# Patient Record
Sex: Female | Born: 1980 | Race: White | Hispanic: No | Smoking: Current every day smoker
Health system: Southern US, Community
[De-identification: ages and names within clinical notes are randomized; demographics above are authoritative.]

## PROBLEM LIST (undated history)

## (undated) DIAGNOSIS — E079 Disorder of thyroid, unspecified: Secondary | ICD-10-CM

## (undated) HISTORY — PX: GALLBLADDER SURGERY: SHX652

## (undated) HISTORY — DX: Disorder of thyroid, unspecified: E07.9

---

## 2002-07-29 ENCOUNTER — Emergency Department (HOSPITAL_COMMUNITY): Admission: EM | Admit: 2002-07-29 | Discharge: 2002-07-29 | Payer: Self-pay

## 2004-08-07 ENCOUNTER — Emergency Department (HOSPITAL_COMMUNITY): Admission: EM | Admit: 2004-08-07 | Discharge: 2004-08-07 | Payer: Self-pay | Admitting: Family Medicine

## 2005-06-12 ENCOUNTER — Ambulatory Visit (HOSPITAL_COMMUNITY): Admission: RE | Admit: 2005-06-12 | Discharge: 2005-06-12 | Payer: Self-pay | Admitting: Family Medicine

## 2007-04-25 ENCOUNTER — Ambulatory Visit (HOSPITAL_COMMUNITY): Admission: RE | Admit: 2007-04-25 | Discharge: 2007-04-25 | Payer: Self-pay | Admitting: Family Medicine

## 2008-04-28 ENCOUNTER — Encounter (INDEPENDENT_AMBULATORY_CARE_PROVIDER_SITE_OTHER): Payer: Self-pay | Admitting: Family Medicine

## 2008-04-28 ENCOUNTER — Other Ambulatory Visit: Admission: RE | Admit: 2008-04-28 | Discharge: 2008-04-28 | Payer: Self-pay | Admitting: Family Medicine

## 2008-04-28 ENCOUNTER — Ambulatory Visit (HOSPITAL_COMMUNITY): Admission: RE | Admit: 2008-04-28 | Discharge: 2008-04-28 | Payer: Self-pay | Admitting: Internal Medicine

## 2009-04-29 ENCOUNTER — Ambulatory Visit (HOSPITAL_COMMUNITY): Admission: RE | Admit: 2009-04-29 | Discharge: 2009-04-29 | Payer: Self-pay | Admitting: Family Medicine

## 2011-10-25 ENCOUNTER — Ambulatory Visit (HOSPITAL_COMMUNITY)
Admission: RE | Admit: 2011-10-25 | Discharge: 2011-10-25 | Disposition: A | Discharge status: Home / Self Care | Payer: Managed Care, Other (non HMO) | Source: Ambulatory Visit | Attending: Internal Medicine | Admitting: Internal Medicine

## 2011-10-25 ENCOUNTER — Other Ambulatory Visit (HOSPITAL_COMMUNITY): Payer: Self-pay | Admitting: Internal Medicine

## 2011-10-25 DIAGNOSIS — M25579 Pain in unspecified ankle and joints of unspecified foot: Secondary | ICD-10-CM | POA: Insufficient documentation

## 2011-10-25 DIAGNOSIS — S93409A Sprain of unspecified ligament of unspecified ankle, initial encounter: Secondary | ICD-10-CM

## 2011-10-25 DIAGNOSIS — X58XXXA Exposure to other specified factors, initial encounter: Secondary | ICD-10-CM | POA: Insufficient documentation

## 2011-10-25 DIAGNOSIS — M7730 Calcaneal spur, unspecified foot: Secondary | ICD-10-CM | POA: Insufficient documentation

## 2014-01-05 ENCOUNTER — Encounter: Payer: Self-pay | Admitting: *Deleted

## 2014-01-07 ENCOUNTER — Ambulatory Visit (INDEPENDENT_AMBULATORY_CARE_PROVIDER_SITE_OTHER): Payer: Managed Care, Other (non HMO) | Admitting: Advanced Practice Midwife

## 2014-01-07 ENCOUNTER — Encounter: Payer: Self-pay | Admitting: Advanced Practice Midwife

## 2014-01-07 VITALS — BP 130/90 | Ht 69.0 in | Wt 199.0 lb

## 2014-01-07 DIAGNOSIS — Z30014 Encounter for initial prescription of intrauterine contraceptive device: Secondary | ICD-10-CM

## 2014-01-07 DIAGNOSIS — Z3009 Encounter for other general counseling and advice on contraception: Secondary | ICD-10-CM

## 2014-01-07 MED ORDER — MISOPROSTOL 200 MCG PO TABS: 200.0000 ug | ORAL_TABLET | Freq: Once | ORAL | Status: AC

## 2014-01-07 NOTE — Progress Notes (Signed)
[facility] Visit  Patient name: Tina Huynh MRN [id number]  Date of birth: 07/21/1980  CC & HPI:  Tina Huynh is a 33 y.o. Caucasian female presenting today for discussion of IUD, referred from [facility]  Pertinent History Reviewed:  Medical & Surgical Hx:   Past Medical History  Diagnosis Date  . Thyroid disease    Past Surgical History  Procedure Laterality Date  . Gallbladder surgery     Medications: Reviewed & Updated - see associated section Social History: Reviewed -  reports that she has been smoking Cigarettes.  She has been smoking about 1.00 pack per day. She has never used smokeless tobacco.  Objective Findings:  Vitals: BP 130/90  Ht 5\' 9"  (1.753 m)  Wt 199 lb (90.266 kg)  BMI 29.37 kg/m2  LMP 12/30/2013  Physical Examination: General appearance - alert, well appearing, and in no distress Mental status - alert, oriented to person, place, and time Extremities - no pedal edema noted Skin - normal coloration and turgor, no rashes, no suspicious skin lesions noted  No results found for this or any previous visit (from the past 24 hour(s)).   Discussed SE/Risks/Benefits of Mirena   Assessment & Plan:  A:   IUD consult P:  Premedicate with cytotec   F/U 2weeks, when on menses for Mirnea     CRESENZO-DISHMAN,Neizan Debruhl CNM 01/07/2014 11:23 AM

## 2014-01-22 ENCOUNTER — Encounter: Payer: Self-pay | Admitting: Advanced Practice Midwife

## 2014-01-22 ENCOUNTER — Ambulatory Visit (INDEPENDENT_AMBULATORY_CARE_PROVIDER_SITE_OTHER): Payer: Managed Care, Other (non HMO) | Admitting: Advanced Practice Midwife

## 2014-01-22 VITALS — BP 138/80 | Ht 69.0 in | Wt 201.0 lb

## 2014-01-22 DIAGNOSIS — Z30430 Encounter for insertion of intrauterine contraceptive device: Secondary | ICD-10-CM

## 2014-01-22 DIAGNOSIS — Z3202 Encounter for pregnancy test, result negative: Secondary | ICD-10-CM

## 2014-01-22 LAB — POCT URINE PREGNANCY: Preg Test, Ur: NEGATIVE

## 2014-01-22 NOTE — Progress Notes (Signed)
Laretha Bethann HumbleM Kysar is a 33 y.o. year old Caucasian female Gravida 1 Para 1  who presents for placement of a Mirena IUD. Her LMP was 01/21/14 and her pregnancy test today is negative.  The risks and benefits of the method and placement have been thouroughly reviewed with the patient and all questions were answered.  Specifically the patient is aware of failure rate of 06/998, expulsion of the IUD and of possible perforation.  The patient is aware of irregular bleeding due to the method and understands the incidence of irregular bleeding diminishes with time.  Time out was performed.  A Graves speculum was placed.  The cervix was prepped using Betadine. The uterus was found to be retroflexed and it sounded to 7 cm.  The cervix was grasped with a tenaculum and the IUD was inserted to 7 cm.  It was pulled back 1 cm and the IUD was disengaged.  The strings were trimmed to 3 cm.  Sonogram was performed and the proper placement of the IUD was verified.  The patient was instructed on signs and symptoms of infection and to check for the strings after each menses or each month.  The patient is to refrain from intercourse for 3 days.  The patient is scheduled for a return appointment after her first menses or 4 weeks.  CRESENZO-DISHMAN,Eutimio Gharibian 01/22/2014 2:03 PM

## 2014-02-19 ENCOUNTER — Ambulatory Visit: Payer: Managed Care, Other (non HMO) | Admitting: Advanced Practice Midwife

## 2014-02-26 ENCOUNTER — Ambulatory Visit: Payer: Managed Care, Other (non HMO) | Admitting: Advanced Practice Midwife

## 2014-04-27 ENCOUNTER — Encounter: Payer: Self-pay | Admitting: Advanced Practice Midwife

## 2015-03-22 ENCOUNTER — Other Ambulatory Visit (HOSPITAL_COMMUNITY): Payer: Self-pay | Admitting: Family Medicine

## 2015-03-22 ENCOUNTER — Ambulatory Visit (HOSPITAL_COMMUNITY)
Admission: RE | Admit: 2015-03-22 | Discharge: 2015-03-22 | Disposition: A | Discharge status: Home / Self Care | Payer: Managed Care, Other (non HMO) | Source: Ambulatory Visit | Attending: Family Medicine | Admitting: Family Medicine

## 2015-03-22 DIAGNOSIS — M25572 Pain in left ankle and joints of left foot: Secondary | ICD-10-CM | POA: Insufficient documentation

## 2015-03-22 DIAGNOSIS — S99912A Unspecified injury of left ankle, initial encounter: Secondary | ICD-10-CM

## 2015-03-22 DIAGNOSIS — R937 Abnormal findings on diagnostic imaging of other parts of musculoskeletal system: Secondary | ICD-10-CM | POA: Diagnosis not present

## 2016-02-05 ENCOUNTER — Encounter (HOSPITAL_COMMUNITY): Payer: Self-pay | Admitting: *Deleted

## 2016-02-05 ENCOUNTER — Emergency Department (HOSPITAL_COMMUNITY)
Admission: EM | Admit: 2016-02-05 | Discharge: 2016-02-06 | Disposition: A | Discharge status: Home / Self Care | Payer: Managed Care, Other (non HMO) | Attending: Emergency Medicine | Admitting: Emergency Medicine

## 2016-02-05 DIAGNOSIS — F17210 Nicotine dependence, cigarettes, uncomplicated: Secondary | ICD-10-CM | POA: Diagnosis not present

## 2016-02-05 DIAGNOSIS — R1032 Left lower quadrant pain: Secondary | ICD-10-CM | POA: Diagnosis present

## 2016-02-05 DIAGNOSIS — Z79899 Other long term (current) drug therapy: Secondary | ICD-10-CM | POA: Insufficient documentation

## 2016-02-05 DIAGNOSIS — M62838 Other muscle spasm: Secondary | ICD-10-CM | POA: Diagnosis not present

## 2016-02-05 DIAGNOSIS — R109 Unspecified abdominal pain: Secondary | ICD-10-CM

## 2016-02-05 MED ORDER — DICYCLOMINE HCL 10 MG/ML IM SOLN
20.0000 mg | Freq: Once | INTRAMUSCULAR | Status: AC
  Administered 2016-02-05: 20 mg via INTRAMUSCULAR
  Filled 2016-02-05: qty 2

## 2016-02-05 NOTE — ED Provider Notes (Signed)
[facility]Y DEPT Provider Note   CSN: [id number] Arrival date & time: 02/05/16  2314  First Provider Contact:   First MD Initiated Contact with Patient 02/05/16 2344        By signing my name below, I, Doreatha MartinEva Mathews, attest that this documentation has been prepared under the direction and in the presence of Devoria AlbeIva Aliece Honold, MD. Electronically Signed: Doreatha MartinEva Mathews, ED Scribe. 02/05/16. 11:54 PM.     History   Chief Complaint Chief Complaint  Patient presents with  . Abdominal Pain    HPI Tina Huynh is a 35 y.o. female who presents to the Emergency Department complaining of moderate generalized abdominal pain onset a few months ago and worsened today. Pt states with the onset of her pain a few months ago, her pain felt similar to pain with gas pains and was previously alleviated with rubbing her abdomen or hot showers. She states her pain is different today, starting early this morning and she has been experiencing moderate, progressively worsening left-sided intermittent abdominal "spasms". Pt states when her pain comes on, it lasts approximately a minute. No worsening factors noted. Pt states her pain is unaffected by ambulation. She also notes the sensation of incomplete voiding that occurred once today. Pt states she was able to tolerate food without difficulty today, but notes yesterday she did not have an appetite d/t stress. Pt is a current daily smoker, 2 ppd. She is a current daily drinker, 48 oz four loco (malt beverage). Pt currently works as a Production designer, theatre/television/filmmanager at [employer]. She denies nausea, emesis, diarrhea, fever, dysuria, frequency, urgency, hematuria.   She reports a lot of stress from "life" recently.   PCP- Colette RibasGOLDING, JOHN CABOT, MD    The history is provided by the patient. No language interpreter was used.    Past Medical History:  Diagnosis Date  . Thyroid disease     Patient Active Problem List   Diagnosis Date Noted  . Encounter for insertion of mirena IUD  01/22/2014    Past Surgical History:  Procedure Laterality Date  . GALLBLADDER SURGERY      OB History    Gravida Para Term Preterm AB Living   1 1 1     1    SAB TAB Ectopic Multiple Live Births           1       Home Medications    Prior to Admission medications   Medication Sig Start Date End Date Taking? Authorizing Provider  levothyroxine (SYNTHROID, LEVOTHROID) 125 MCG tablet Take 125 mcg by mouth daily before breakfast.   Yes Historical Provider, MD  buPROPion (WELLBUTRIN XL) 150 MG 24 hr tablet Take 150 mg by mouth daily.    Historical Provider, MD  dicyclomine (BENTYL) 20 MG tablet Take 1 po TID with meals and bedtime prn abdominal pain 02/06/16   Devoria AlbeIva Shernita Rabinovich, MD  misoprostol (CYTOTEC) 200 MCG tablet Take 1 tablet (200 mcg total) by mouth once. Take 6 hours before appointment 01/07/14   Jacklyn ShellFrances Cresenzo-Dishmon, CNM    Family History Family History  Problem Relation Age of Onset  . Diabetes Father   . Diabetes Mother   . Heart Problems Mother     Social History Social History  Substance Use Topics  . Smoking status: Current Every Day Smoker    Packs/day: 1.00    Types: Cigarettes  . Smokeless tobacco: Never Used  . Alcohol use No  smokes 2 ppd Drinks 48 ounces malted drink daily Employed Lives  with spouse   Allergies   Review of patient's allergies indicates no known allergies.   Review of Systems Review of Systems  Constitutional: Negative for fever.  Gastrointestinal: Positive for abdominal pain. Negative for diarrhea, nausea and vomiting.  Genitourinary: Negative for dysuria, frequency, hematuria and urgency.       +one episode of incomplete voiding  All other systems reviewed and are negative.    Physical Exam Updated Vital Signs BP 158/90   Pulse 105   Temp 97.9 F (36.6 C) (Oral)   Resp 20   Ht 5\' 10"  (1.778 m)   Wt 190 lb (86.2 kg)   SpO2 100%   BMI 27.26 kg/m   Vital signs normal except for tachycardia   Physical Exam    Constitutional: She is oriented to person, place, and time. She appears well-developed and well-nourished.  Non-toxic appearance. She does not appear ill. No distress.  HENT:  Head: Normocephalic and atraumatic.  Right Ear: External ear normal.  Left Ear: External ear normal.  Nose: Nose normal. No mucosal edema or rhinorrhea.  Mouth/Throat: Oropharynx is clear and moist and mucous membranes are normal. No dental abscesses or uvula swelling.  Tongue dry.   Eyes: Conjunctivae and EOM are normal. Pupils are equal, round, and reactive to light.  Neck: Normal range of motion and full passive range of motion without pain. Neck supple.  Cardiovascular: Normal rate, regular rhythm and normal heart sounds.  Exam reveals no gallop and no friction rub.   No murmur heard. Pulmonary/Chest: Effort normal and breath sounds normal. No respiratory distress. She has no wheezes. She has no rhonchi. She has no rales. She exhibits no tenderness and no crepitus.  Abdominal: Soft. Normal appearance and bowel sounds are normal. She exhibits no distension. There is tenderness. There is no rebound and no guarding.    LLQ pain to palpation.   Musculoskeletal: Normal range of motion. She exhibits no edema or tenderness.  Moves all extremities well.   Neurological: She is alert and oriented to person, place, and time. She has normal strength. No cranial nerve deficit.  Skin: Skin is warm, dry and intact. No rash noted. No erythema. No pallor.  Psychiatric: She has a normal mood and affect. Her speech is normal and behavior is normal. Her mood appears not anxious.  Nursing note and vitals reviewed.    ED Treatments / Results  Labs (all labs ordered are listed, but only abnormal results are displayed) Results for orders placed or performed during the hospital encounter of 02/05/16  Comprehensive metabolic panel  Result Value Ref Range   Sodium 141 135 - 145 mmol/L   Potassium 3.2 (L) 3.5 - 5.1 mmol/L    Chloride 107 101 - 111 mmol/L   CO2 26 22 - 32 mmol/L   Glucose, Bld 106 (H) 65 - 99 mg/dL   BUN 5 (L) 6 - 20 mg/dL   Creatinine, Ser 1.61 0.44 - 1.00 mg/dL   Calcium 8.7 (L) 8.9 - 10.3 mg/dL   Total Protein 6.9 6.5 - 8.1 g/dL   Albumin 3.8 3.5 - 5.0 g/dL   AST 23 15 - 41 U/L   ALT 18 14 - 54 U/L   Alkaline Phosphatase 47 38 - 126 U/L   Total Bilirubin 0.4 0.3 - 1.2 mg/dL   GFR calc non Af Amer >60 >60 mL/min   GFR calc Af Amer >60 >60 mL/min   Anion gap 8 5 - 15  Lipase, blood  Result Value Ref  Range   Lipase 21 11 - 51 U/L  CBC with Differential  Result Value Ref Range   WBC 7.6 4.0 - 10.5 K/uL   RBC 4.03 3.87 - 5.11 MIL/uL   Hemoglobin 13.9 12.0 - 15.0 g/dL   HCT 16.1 09.6 - 04.5 %   MCV 101.0 (H) 78.0 - 100.0 fL   MCH 34.5 (H) 26.0 - 34.0 pg   MCHC 34.2 30.0 - 36.0 g/dL   RDW 40.9 81.1 - 91.4 %   Platelets 257 150 - 400 K/uL   Neutrophils Relative % 47 %   Neutro Abs 3.5 1.7 - 7.7 K/uL   Lymphocytes Relative 43 %   Lymphs Abs 3.2 0.7 - 4.0 K/uL   Monocytes Relative 5 %   Monocytes Absolute 0.4 0.1 - 1.0 K/uL   Eosinophils Relative 5 %   Eosinophils Absolute 0.4 0.0 - 0.7 K/uL   Basophils Relative 0 %   Basophils Absolute 0.0 0.0 - 0.1 K/uL  Urinalysis, Routine w reflex microscopic  Result Value Ref Range   Color, Urine YELLOW YELLOW   APPearance CLEAR CLEAR   Specific Gravity, Urine <1.005 (L) 1.005 - 1.030   pH 6.0 5.0 - 8.0   Glucose, UA NEGATIVE NEGATIVE mg/dL   Hgb urine dipstick SMALL (A) NEGATIVE   Bilirubin Urine NEGATIVE NEGATIVE   Ketones, ur NEGATIVE NEGATIVE mg/dL   Protein, ur NEGATIVE NEGATIVE mg/dL   Nitrite NEGATIVE NEGATIVE   Leukocytes, UA NEGATIVE NEGATIVE  Urine microscopic-add on  Result Value Ref Range   Squamous Epithelial / LPF 6-30 (A) NONE SEEN   WBC, UA 0-5 0 - 5 WBC/hpf   RBC / HPF 0-5 0 - 5 RBC/hpf   Bacteria, UA FEW (A) NONE SEEN   Laboratory interpretation all normal except Mild hypokalemia, contaminated  urinalysis    EKG  EKG Interpretation None       Radiology No results found.  Procedures Procedures (including critical care time)  DIAGNOSTIC STUDIES: Oxygen Saturation is 100% on RA, normal by my interpretation.     Medications Ordered in ED Medications  dicyclomine (BENTYL) injection 20 mg (20 mg Intramuscular Given 02/05/16 2359)     Initial Impression / Assessment and Plan / ED Course  I have reviewed the triage vital signs and the nursing notes.  Pertinent labs & imaging results that were available during my care of the patient were reviewed by me and considered in my medical decision making (see chart for details).  Clinical Course   11:52 PM Discussed treatment plan with pt at bedside which includes lab work, UA and pt agreed to plan. Patient was given Bentyl for her abdominal pain, I suspect she may be having intestinal spasms due to stress.  Recheck at 2:10 AM patient is sleeping soundly, when awakened she states her pain is gone. We discussed her test results which were basically normal. She was discharged home with Bentyl to take as needed. She was advised to avoid any fried, spicy, or greasy foods for the next 1-2 weeks. She should be rechecked if she gets a fever, has uncontrolled vomiting or diarrhea, or if her pain returns and is not relieved with the Bentyl.  Final Clinical Impressions(s) / ED Diagnoses   Final diagnoses:  LLQ pain  Abdominal spasms    New Prescriptions New Prescriptions   DICYCLOMINE (BENTYL) 20 MG TABLET    Take 1 po TID with meals and bedtime prn abdominal pain    I personally performed the services described  in this documentation, which was scribed in my presence. The recorded information has been reviewed and considered.  Devoria Albe, MD, Concha Pyo, MD 02/06/16 [phone]

## 2016-02-05 NOTE — ED Triage Notes (Signed)
Pt c/o abd pain that is described as a spasm or contraction in nature, denies any n/v/d, fever, states that the pain started today,

## 2016-02-06 LAB — CBC WITH DIFFERENTIAL/PLATELET
BASOS ABS: 0 10*3/uL (ref 0.0–0.1)
BASOS PCT: 0 %
Eosinophils Absolute: 0.4 10*3/uL (ref 0.0–0.7)
Eosinophils Relative: 5 %
HCT: 40.7 % (ref 36.0–46.0)
HEMOGLOBIN: 13.9 g/dL (ref 12.0–15.0)
Lymphocytes Relative: 43 %
Lymphs Abs: 3.2 10*3/uL (ref 0.7–4.0)
MCH: 34.5 pg — AB (ref 26.0–34.0)
MCHC: 34.2 g/dL (ref 30.0–36.0)
MCV: 101 fL — AB (ref 78.0–100.0)
Monocytes Absolute: 0.4 10*3/uL (ref 0.1–1.0)
Monocytes Relative: 5 %
NEUTROS PCT: 47 %
Neutro Abs: 3.5 10*3/uL (ref 1.7–7.7)
Platelets: 257 10*3/uL (ref 150–400)
RBC: 4.03 MIL/uL (ref 3.87–5.11)
RDW: 12.9 % (ref 11.5–15.5)
WBC: 7.6 10*3/uL (ref 4.0–10.5)

## 2016-02-06 LAB — URINALYSIS, ROUTINE W REFLEX MICROSCOPIC
Bilirubin Urine: NEGATIVE
Glucose, UA: NEGATIVE mg/dL
Ketones, ur: NEGATIVE mg/dL
Leukocytes, UA: NEGATIVE
Nitrite: NEGATIVE
Protein, ur: NEGATIVE mg/dL
Specific Gravity, Urine: <1.005 — ABNORMAL LOW (ref 1.005–1.030)
pH: 6 (ref 5.0–8.0)

## 2016-02-06 LAB — URINE MICROSCOPIC-ADD ON

## 2016-02-06 LAB — COMPREHENSIVE METABOLIC PANEL
ALT: 18 U/L (ref 14–54)
AST: 23 U/L (ref 15–41)
Albumin: 3.8 g/dL (ref 3.5–5.0)
Alkaline Phosphatase: 47 U/L (ref 38–126)
Anion gap: 8 (ref 5–15)
BUN: 5 mg/dL — ABNORMAL LOW (ref 6–20)
CO2: 26 mmol/L (ref 22–32)
Calcium: 8.7 mg/dL — ABNORMAL LOW (ref 8.9–10.3)
Chloride: 107 mmol/L (ref 101–111)
Creatinine, Ser: 0.81 mg/dL (ref 0.44–1.00)
GFR calc Af Amer: >60 mL/min (ref >60)
GFR calc non Af Amer: >60 mL/min (ref >60)
Glucose, Bld: 106 mg/dL — ABNORMAL HIGH (ref 65–99)
Potassium: 3.2 mmol/L — ABNORMAL LOW (ref 3.5–5.1)
SODIUM: 141 mmol/L (ref 135–145)
Total Bilirubin: 0.4 mg/dL (ref 0.3–1.2)
Total Protein: 6.9 g/dL (ref 6.5–8.1)

## 2016-02-06 LAB — LIPASE, BLOOD: Lipase: 21 U/L (ref 11–51)

## 2016-02-06 MED ORDER — DICYCLOMINE HCL 20 MG PO TABS: ORAL_TABLET | ORAL | 0 refills | Status: AC

## 2016-02-06 NOTE — Discharge Instructions (Signed)
Drink plenty of fluids, avoid fried, spicy or greasy foods for the next 1-2 weeks. Take the bentyl if you get the pain again.   Return to the ED if you get a fever, have uncontrolled vomiting or diarrhea, or your pain gets worse and isn't relieved with the bentyl.

## 2016-02-06 NOTE — ED Notes (Signed)
Patient ambulatory to restroom to collect urine sample. Family member at bedside. No needs voiced at this time

## 2016-02-06 NOTE — ED Notes (Signed)
Patient asleep and snoring. Awakened patient to obtain vitals. Patient went back to sleep.

## 2016-02-06 NOTE — ED Notes (Signed)
Patient verbalizes understanding of discharge instructions, prescriptions, home care and follow up care. Patient out of the department at this time.

## 2017-01-07 IMAGING — DX DG ANKLE COMPLETE 3+V*L*
3 series · 3 of 3 positions shown · non-contrast
Comparison: None.

CLINICAL DATA: Left ankle injury. Pain, swelling, bruising along
lateral left ankle. Fall [REDACTED].

EXAM:
LEFT ANKLE COMPLETE - 3+ VIEW

[ankle ap]
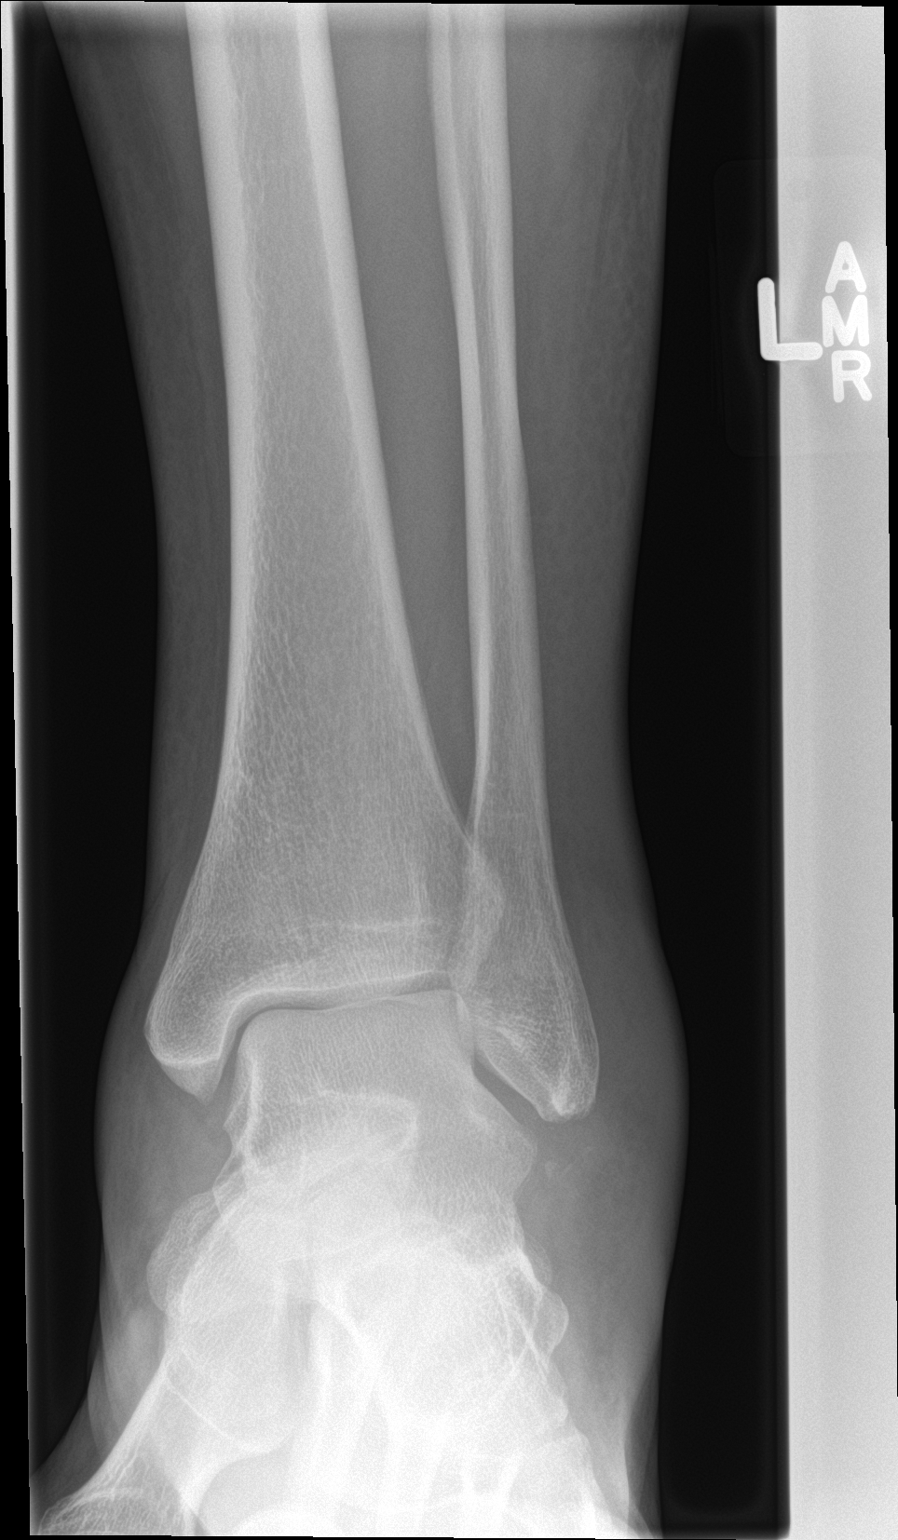

[ankle obl]
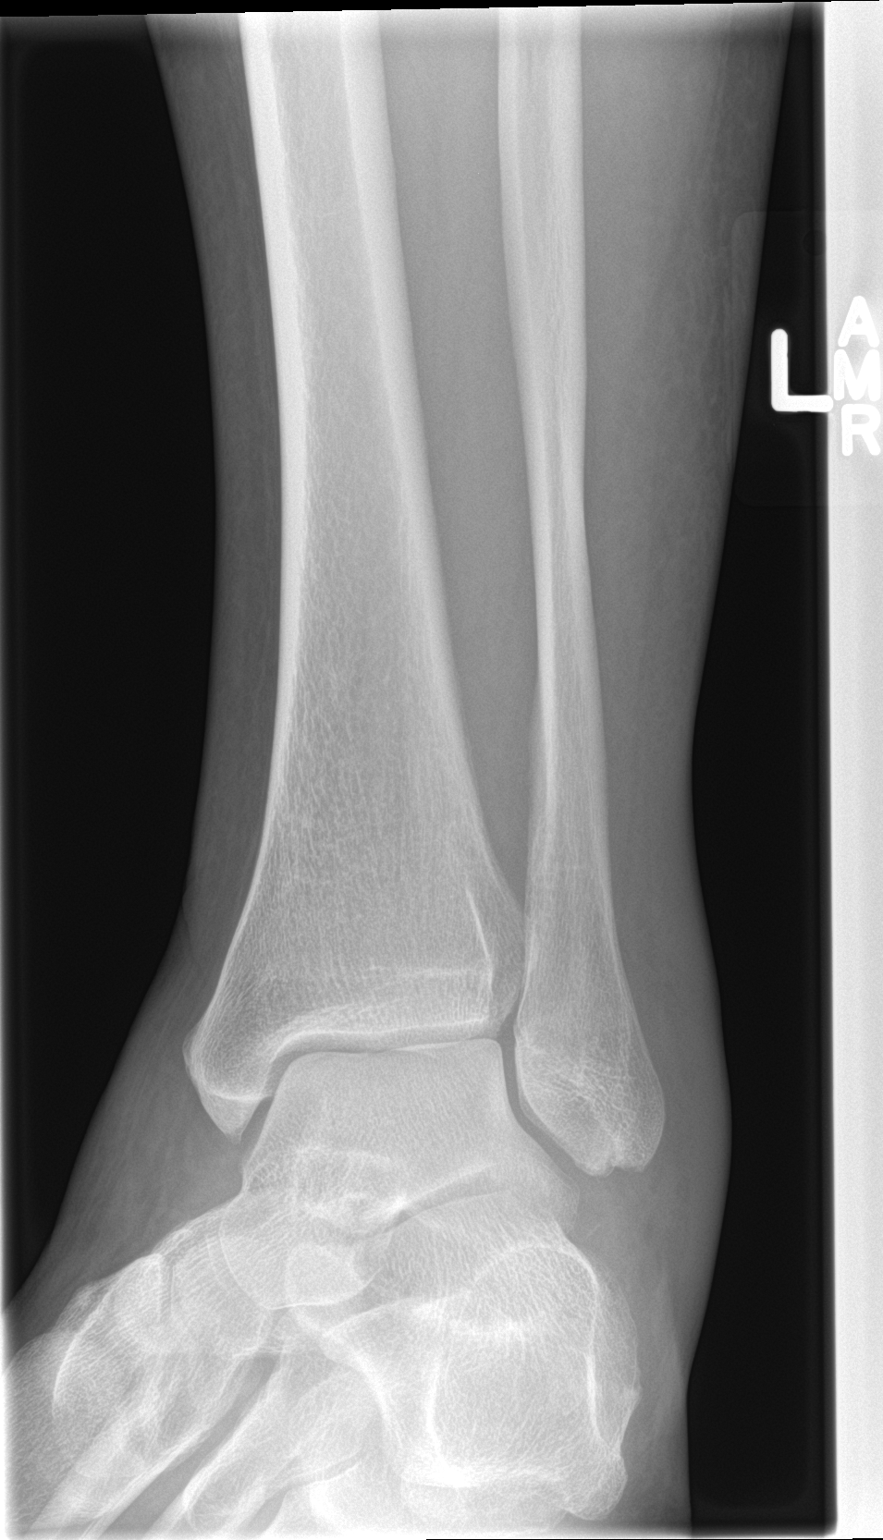

[ankle lat]
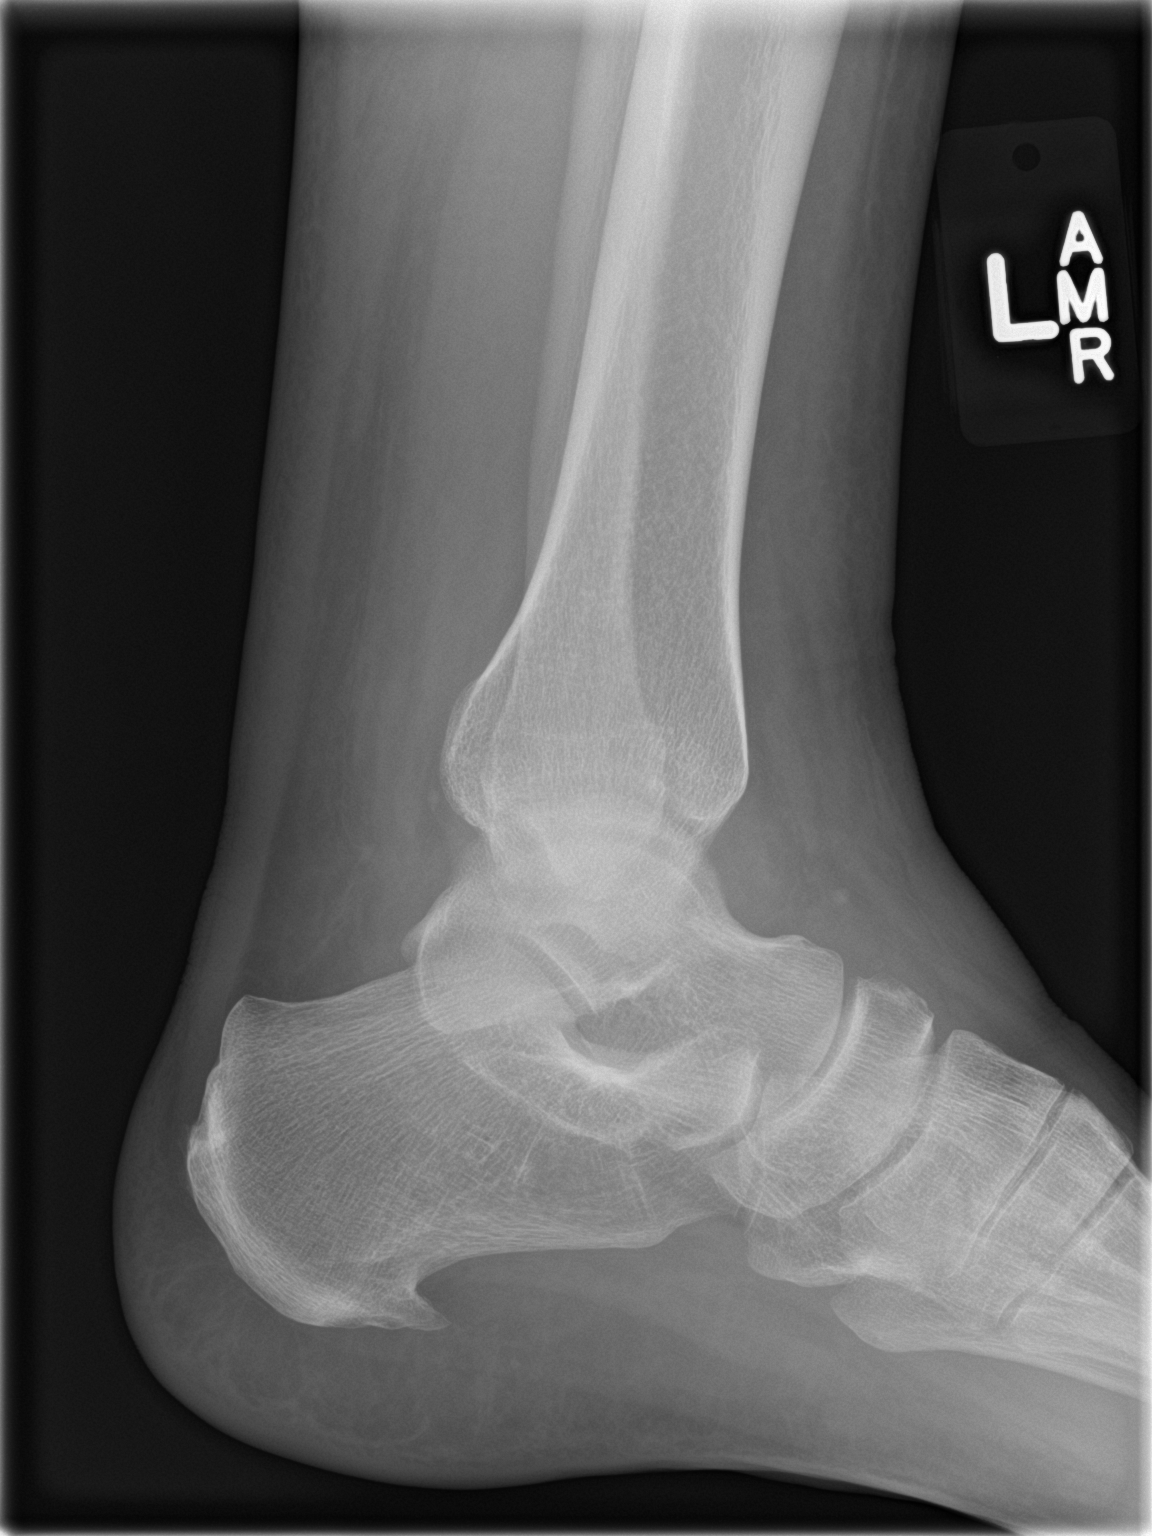

[3 of 3 positions shown; findings below may reference images not displayed]

FINDINGS: Lateral soft tissue swelling. Small avulsed fragment noted lateral
to the talus, inferior to the lateral malleolus. No additional acute
bony abnormality.
IMPRESSION: Small avulsed fragment, likely from the lateral talus.
